# Patient Record
Sex: Male | Born: 1955 | Race: White | Hispanic: No | Marital: Married | Smoking: Never smoker
Health system: Southern US, Community
[De-identification: ages and names within clinical notes are randomized; demographics above are authoritative.]

## PROBLEM LIST (undated history)

## (undated) DIAGNOSIS — I4891 Unspecified atrial fibrillation: Secondary | ICD-10-CM

## (undated) DIAGNOSIS — M25561 Pain in right knee: Secondary | ICD-10-CM

## (undated) DIAGNOSIS — M25569 Pain in unspecified knee: Secondary | ICD-10-CM

## (undated) DIAGNOSIS — G8929 Other chronic pain: Secondary | ICD-10-CM

## (undated) DIAGNOSIS — M25562 Pain in left knee: Secondary | ICD-10-CM

## (undated) DIAGNOSIS — I10 Essential (primary) hypertension: Secondary | ICD-10-CM

## (undated) DIAGNOSIS — K635 Polyp of colon: Secondary | ICD-10-CM

## (undated) DIAGNOSIS — C439 Malignant melanoma of skin, unspecified: Secondary | ICD-10-CM

## (undated) DIAGNOSIS — K8050 Calculus of bile duct without cholangitis or cholecystitis without obstruction: Secondary | ICD-10-CM

## (undated) HISTORY — DX: Unspecified atrial fibrillation: I48.91

## (undated) HISTORY — DX: Pain in left knee: M25.562

## (undated) HISTORY — DX: Other chronic pain: G89.29

## (undated) HISTORY — DX: Calculus of bile duct without cholangitis or cholecystitis without obstruction: K80.50

## (undated) HISTORY — DX: Pain in unspecified knee: M25.569

## (undated) HISTORY — DX: Pain in left knee: M25.561

## (undated) HISTORY — DX: Essential (primary) hypertension: I10

## (undated) HISTORY — DX: Polyp of colon: K63.5

## (undated) HISTORY — DX: Malignant melanoma of skin, unspecified: C43.9

---

## 1987-07-25 DIAGNOSIS — C439 Malignant melanoma of skin, unspecified: Secondary | ICD-10-CM

## 1987-07-25 HISTORY — DX: Malignant melanoma of skin, unspecified: C43.9

## 1987-07-25 HISTORY — PX: MM US AXILLA RIGHT (ARMC HX): HXRAD1092

## 1990-01-21 HISTORY — PX: MM US AXILLA LEFT (ARMC HX): HXRAD1093

## 2010-07-24 HISTORY — PX: GALLBLADDER SURGERY: SHX652

## 2017-09-18 ENCOUNTER — Other Ambulatory Visit
Admission: RE | Admit: 2017-09-18 | Discharge: 2017-09-18 | Disposition: A | Discharge status: Home / Self Care | Payer: BLUE CROSS/BLUE SHIELD | Source: Ambulatory Visit | Attending: Medical | Admitting: Medical

## 2017-09-18 DIAGNOSIS — Z79899 Other long term (current) drug therapy: Secondary | ICD-10-CM | POA: Diagnosis not present

## 2017-09-18 DIAGNOSIS — L603 Nail dystrophy: Secondary | ICD-10-CM | POA: Diagnosis present

## 2017-09-18 LAB — HEPATIC FUNCTION PANEL
ALBUMIN: 4.4 g/dL (ref 3.5–5.0)
ALK PHOS: 53 U/L (ref 38–126)
ALT: 31 U/L (ref 17–63)
AST: 23 U/L (ref 15–41)
BILIRUBIN TOTAL: 0.8 mg/dL (ref 0.3–1.2)
Bilirubin, Direct: 0.1 mg/dL (ref 0.1–0.5)
Indirect Bilirubin: 0.7 mg/dL (ref 0.3–0.9)
TOTAL PROTEIN: 7.2 g/dL (ref 6.5–8.1)

## 2017-10-11 ENCOUNTER — Encounter: Payer: Self-pay | Admitting: Family Medicine

## 2017-10-11 ENCOUNTER — Ambulatory Visit (INDEPENDENT_AMBULATORY_CARE_PROVIDER_SITE_OTHER): Payer: BLUE CROSS/BLUE SHIELD | Admitting: Family Medicine

## 2017-10-11 VITALS — BP 122/84 | HR 84 | Resp 16 | Ht 68.0 in | Wt 252.0 lb

## 2017-10-11 DIAGNOSIS — K635 Polyp of colon: Secondary | ICD-10-CM

## 2017-10-11 DIAGNOSIS — M25562 Pain in left knee: Secondary | ICD-10-CM

## 2017-10-11 DIAGNOSIS — L309 Dermatitis, unspecified: Secondary | ICD-10-CM | POA: Diagnosis not present

## 2017-10-11 DIAGNOSIS — I10 Essential (primary) hypertension: Secondary | ICD-10-CM | POA: Diagnosis not present

## 2017-10-11 DIAGNOSIS — Z85820 Personal history of malignant melanoma of skin: Secondary | ICD-10-CM

## 2017-10-11 DIAGNOSIS — Z0000 Encounter for general adult medical examination without abnormal findings: Secondary | ICD-10-CM | POA: Diagnosis not present

## 2017-10-11 DIAGNOSIS — M25569 Pain in unspecified knee: Secondary | ICD-10-CM

## 2017-10-11 DIAGNOSIS — Z23 Encounter for immunization: Secondary | ICD-10-CM

## 2017-10-11 DIAGNOSIS — M25561 Pain in right knee: Secondary | ICD-10-CM | POA: Diagnosis not present

## 2017-10-11 DIAGNOSIS — E669 Obesity, unspecified: Secondary | ICD-10-CM | POA: Diagnosis not present

## 2017-10-11 DIAGNOSIS — H6121 Impacted cerumen, right ear: Secondary | ICD-10-CM | POA: Diagnosis not present

## 2017-10-11 DIAGNOSIS — B351 Tinea unguium: Secondary | ICD-10-CM | POA: Diagnosis not present

## 2017-10-11 DIAGNOSIS — I4891 Unspecified atrial fibrillation: Secondary | ICD-10-CM | POA: Diagnosis not present

## 2017-10-11 DIAGNOSIS — G8929 Other chronic pain: Secondary | ICD-10-CM | POA: Insufficient documentation

## 2017-10-11 NOTE — Progress Notes (Addendum)
Date:  10/11/2017   Name:  David Evans   DOB:  1956/02/01   MRN:  [id number]  PCP:  Adline Potter, MD    Chief Complaint: Establish Care ([place] moved here 7 weeks ago. ) and Tinnitus (Has wax build up and tried drops and several other things and can not get this better. ENT? )   History of Present Illness:  This is a 62 y.o. male seen for initial visit. C/o wax buildup/ringing R ear, OTC flush not effective. HTN on metoprolol/amlodipine, hx Pafib on asa only per cards, no episodes past month. Uses Lidex prn for leg rash, on Lamisil past two months for onychomycosis multiple toenails. Hx malignant melanoma 1989 no sequalae. Colonic polyps, gets colonoscopy q35yr, last 2 yrs ago. Seeing ortho in [place] for B knee pain. Father died 62 dementia/CVA/DM, mother alive 11 breast ca/afib/blood clots, sibs ok. Tet status unknown, no pneumo or zoster imms.  Review of Systems:  Review of Systems  Constitutional: Negative for chills and fever.  HENT: Negative for ear pain, sinus pain and sore throat.   Respiratory: Negative for cough and shortness of breath.   Cardiovascular: Negative for chest pain and leg swelling.  Gastrointestinal: Negative for abdominal pain, constipation and diarrhea.  Endocrine: Negative for polydipsia and polyuria.  Genitourinary: Negative for difficulty urinating.  Neurological: Negative for syncope and light-headedness.    Patient Active Problem List   Diagnosis Date Noted  . 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 Hypertension 10/11/2017  . Onychomycosis 10/11/2017  . Obesity (BMI 30-39.9) 10/11/2017  . Colon polyps 10/11/2017  . Eczema 10/11/2017  . History of malignant melanoma 10/11/2017  . Chronic knee pain 10/11/2017    Prior to Admission medications   Medication Sig Start Date End Date Taking? Authorizing Provider  amLODipine (NORVASC) 5 MG tablet Take 5 mg by mouth daily. 09/11/17  Yes [provider]  fluocinonide cream (LIDEX) 0.05  % APPLY TWICEA DAY TO RASH ON LEGS FOR SEVERE FLARE (NOT ON FACE) 07/19/17  Yes [provider]  metoprolol succinate (TOPROL-XL) 25 MG 24 hr tablet Take 1 tablet by mouth daily. 08/06/17  Yes [provider]  terbinafine (LAMISIL) 250 MG tablet Take 1 tablet by mouth daily. 09/02/17  Yes [provider]    No Known Allergies  Past Surgical History:  Procedure Laterality Date  . GALLBLADDER SURGERY  07/24/2010  . MM [place] AXILLA LEFT ([place] HX)  01/21/1990  . MM [place] AXILLA RIGHT ([place] HX)  1989    Social History   Tobacco Use  . Smoking status: Never Smoker  . Smokeless tobacco: Never Used  Substance Use Topics  . Alcohol use: Never    Frequency: Never  . Drug use: Never    Family History  Problem Relation Age of Onset  . Cancer Mother        breast  . Atrial fibrillation Mother   . Diabetes Father   . Stroke Father   . Cancer Sister   . Cancer Brother        appenix    Medication list has been reviewed and updated.  Physical Examination: BP 122/84   Pulse 84   Resp 16   Ht 5\' 8"  (1.727 m)   Wt 252 lb (114.3 kg)   SpO2 98%   BMI 38.32 kg/m   Physical Exam  Constitutional: He is oriented to person, place, and time. He appears well-developed and well-nourished.  HENT:  Head: Normocephalic and atraumatic.  Right Ear: External ear normal.  Left Ear: External ear normal.  Nose: Nose normal.  Mouth/Throat: Oropharynx is clear and moist.  R TM obscured by cerumen, clear after flush  Eyes: Pupils are equal, round, and reactive to light. Conjunctivae and EOM are normal.  Neck: Neck supple. No thyromegaly present.  Cardiovascular: Normal rate, regular rhythm, normal heart sounds and intact distal pulses.  Pulmonary/Chest: Effort normal and breath sounds normal.  Abdominal: Soft. He exhibits no distension and no mass.  Musculoskeletal: He exhibits no edema.  Lymphadenopathy:    He has no cervical adenopathy.  Neurological: He is alert and  oriented to person, place, and time. Coordination normal.  Romberg neg, gait normal  Skin: Skin is warm and dry.  Onychomycosis multiple toenails  Psychiatric: He has a normal mood and affect. His behavior is normal.  Nursing note and vitals reviewed.   Assessment and Plan:  1. Lone atrial fibrillation [facility]) On metoprolol/asa only per cards  2. Essential hypertension Well controlled on metoprolol XL, amlodipine - Comprehensive Metabolic Panel (CMET) - CBC  3. Impacted cerumen of right ear Flushed to clear, recommended monthly OTC flushes  4. Onychomycosis On Lamisil x 6 months per derm in [place]  5. Chronic pain of both knees Seeing ortho in [place]  6. Eczema, unspecified type On Lidex prn  7. Obesity (BMI 30-39.9) Exercise/weight loss discussed - TSH - Lipid Profile  8. Polyp of colon, unspecified part of colon, unspecified type Colonoscopy q39yr, due 2020  9. History of malignant melanoma 1989 No sequelae  10. Need for diphtheria-tetanus-pertussis (Tdap) vaccine - Tdap vaccine greater than or equal to 7yo IM  11. Healthcare maintenance Consider Shingrix next visit - Hepatitis C Antibody - HIV antibody (with reflex)  Return in about 1 month (around 11/08/2017).   One hour spent with pt/family over half in counseling  Teia Freitas M. [facility]  10/11/2017

## 2017-10-12 LAB — COMPREHENSIVE METABOLIC PANEL
A/G RATIO: 1.8 (ref 1.2–2.2)
ALK PHOS: 55 IU/L (ref 39–117)
ALT: 32 IU/L (ref 0–44)
AST: 23 IU/L (ref 0–40)
Albumin: 4.5 g/dL (ref 3.6–4.8)
BILIRUBIN TOTAL: 0.4 mg/dL (ref 0.0–1.2)
BUN / CREAT RATIO: 12 (ref 10–24)
BUN: 12 mg/dL (ref 8–27)
CHLORIDE: 103 mmol/L (ref 96–106)
CO2: 23 mmol/L (ref 20–29)
Calcium: 9.3 mg/dL (ref 8.6–10.2)
Creatinine, Ser: 1.02 mg/dL (ref 0.76–1.27)
GFR calc Af Amer: 92 mL/min/{1.73_m2} (ref >59)
GFR calc non Af Amer: 80 mL/min/{1.73_m2} (ref >59)
GLOBULIN, TOTAL: 2.5 g/dL (ref 1.5–4.5)
GLUCOSE: 96 mg/dL (ref 65–99)
POTASSIUM: 4 mmol/L (ref 3.5–5.2)
SODIUM: 141 mmol/L (ref 134–144)
Total Protein: 7 g/dL (ref 6.0–8.5)

## 2017-10-12 LAB — CBC
Hematocrit: 42.4 % (ref 37.5–51.0)
Hemoglobin: 14.3 g/dL (ref 13.0–17.7)
MCH: 30 pg (ref 26.6–33.0)
MCHC: 33.7 g/dL (ref 31.5–35.7)
MCV: 89 fL (ref 79–97)
Platelets: 169 10*3/uL (ref 150–379)
RBC: 4.77 x10E6/uL (ref 4.14–5.80)
RDW: 16.5 % — AB (ref 12.3–15.4)
WBC: 6.5 10*3/uL (ref 3.4–10.8)

## 2017-10-12 LAB — LIPID PANEL
CHOLESTEROL TOTAL: 160 mg/dL (ref 100–199)
Chol/HDL Ratio: 4 ratio (ref 0.0–5.0)
HDL: 40 mg/dL (ref >39)
LDL Calculated: 95 mg/dL (ref 0–99)
TRIGLYCERIDES: 125 mg/dL (ref 0–149)
VLDL Cholesterol Cal: 25 mg/dL (ref 5–40)

## 2017-10-12 LAB — HEPATITIS C ANTIBODY: Hep C Virus Ab: <0.1 s/co ratio (ref 0.0–0.9)

## 2017-10-12 LAB — HIV ANTIBODY (ROUTINE TESTING W REFLEX): HIV Screen 4th Generation wRfx: NONREACTIVE

## 2017-10-12 LAB — TSH: TSH: 4.88 u[IU]/mL — AB (ref 0.450–4.500)

## 2018-06-30 ENCOUNTER — Emergency Department
Admission: EM | Admit: 2018-06-30 | Discharge: 2018-06-30 | Disposition: A | Discharge status: Home / Self Care | Payer: BLUE CROSS/BLUE SHIELD | Attending: Emergency Medicine | Admitting: Emergency Medicine

## 2018-06-30 ENCOUNTER — Other Ambulatory Visit: Payer: Self-pay

## 2018-06-30 ENCOUNTER — Encounter: Payer: Self-pay | Admitting: Emergency Medicine

## 2018-06-30 DIAGNOSIS — Z7982 Long term (current) use of aspirin: Secondary | ICD-10-CM | POA: Insufficient documentation

## 2018-06-30 DIAGNOSIS — Y998 Other external cause status: Secondary | ICD-10-CM | POA: Insufficient documentation

## 2018-06-30 DIAGNOSIS — Z85820 Personal history of malignant melanoma of skin: Secondary | ICD-10-CM | POA: Insufficient documentation

## 2018-06-30 DIAGNOSIS — I10 Essential (primary) hypertension: Secondary | ICD-10-CM | POA: Insufficient documentation

## 2018-06-30 DIAGNOSIS — Y929 Unspecified place or not applicable: Secondary | ICD-10-CM | POA: Diagnosis not present

## 2018-06-30 DIAGNOSIS — T148XXA Other injury of unspecified body region, initial encounter: Secondary | ICD-10-CM | POA: Diagnosis not present

## 2018-06-30 DIAGNOSIS — R109 Unspecified abdominal pain: Secondary | ICD-10-CM | POA: Insufficient documentation

## 2018-06-30 DIAGNOSIS — Y9389 Activity, other specified: Secondary | ICD-10-CM | POA: Diagnosis not present

## 2018-06-30 DIAGNOSIS — X58XXXA Exposure to other specified factors, initial encounter: Secondary | ICD-10-CM | POA: Diagnosis not present

## 2018-06-30 DIAGNOSIS — Z79899 Other long term (current) drug therapy: Secondary | ICD-10-CM | POA: Insufficient documentation

## 2018-06-30 LAB — URINALYSIS, COMPLETE (UACMP) WITH MICROSCOPIC
Bacteria, UA: NONE SEEN
Bilirubin Urine: NEGATIVE
Glucose, UA: NEGATIVE mg/dL
Hgb urine dipstick: NEGATIVE
KETONES UR: NEGATIVE mg/dL
Leukocytes, UA: NEGATIVE
Nitrite: NEGATIVE
PH: 5 (ref 5.0–8.0)
Protein, ur: NEGATIVE mg/dL
SPECIFIC GRAVITY, URINE: 1.021 (ref 1.005–1.030)
Squamous Epithelial / LPF: NONE SEEN (ref 0–5)

## 2018-06-30 LAB — CBC
HCT: 42.8 % (ref 39.0–52.0)
HEMOGLOBIN: 13.9 g/dL (ref 13.0–17.0)
MCH: 27.7 pg (ref 26.0–34.0)
MCHC: 32.5 g/dL (ref 30.0–36.0)
MCV: 85.4 fL (ref 80.0–100.0)
NRBC: 0 % (ref 0.0–0.2)
PLATELETS: 147 10*3/uL — AB (ref 150–400)
RBC: 5.01 MIL/uL (ref 4.22–5.81)
RDW: 12.4 % (ref 11.5–15.5)
WBC: 7 10*3/uL (ref 4.0–10.5)

## 2018-06-30 LAB — BASIC METABOLIC PANEL
ANION GAP: 6 (ref 5–15)
BUN: 20 mg/dL (ref 8–23)
CALCIUM: 9.2 mg/dL (ref 8.9–10.3)
CHLORIDE: 107 mmol/L (ref 98–111)
CO2: 29 mmol/L (ref 22–32)
Creatinine, Ser: 1.13 mg/dL (ref 0.61–1.24)
GFR calc non Af Amer: >60 mL/min (ref >60)
Glucose, Bld: 109 mg/dL — ABNORMAL HIGH (ref 70–99)
POTASSIUM: 4.4 mmol/L (ref 3.5–5.1)
Sodium: 142 mmol/L (ref 135–145)

## 2018-06-30 MED ORDER — NAPROXEN 500 MG PO TABS: 500.0000 mg | ORAL_TABLET | Freq: Two times a day (BID) | ORAL | 2 refills | Status: AC

## 2018-06-30 NOTE — ED Provider Notes (Signed)
[facility] Emergency Department Provider Note   ____________________________________________    I have reviewed the triage vital signs and the nursing notes.   HISTORY  Chief Complaint Flank Pain     HPI David Evans is a 62 y.o. male who presents with complaints of left and right flank discomfort which he noticed 2 days ago.  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  He denies any new activity recently.  No lower extremity pain or numbness or weakness.  No nausea or vomiting.  No hematuria.  No history of kidney stones.  Denies lower abdominal pain.  No radiation of pain.  No back pain.  Has not taken anything for this.   Past Medical History:  Diagnosis Date  . A-fib ([place])   . Colon polyps    every 3 year screening   . Gall bladder pain   . Hypertension   . Knee pain, bilateral   . Knee pain, chronic    Old Fracture   . Malignant melanoma ([place]) 1989   under right arm     Patient Active Problem List   Diagnosis Date Noted  . 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 Hypertension 10/11/2017  . Onychomycosis 10/11/2017  . Obesity (BMI 30-39.9) 10/11/2017  . Colon polyps 10/11/2017  . Eczema 10/11/2017  . History of malignant melanoma 10/11/2017  . Chronic knee pain 10/11/2017    Past Surgical History:  Procedure Laterality Date  . GALLBLADDER SURGERY  07/24/2010  . MM [place] AXILLA LEFT ([place] HX)  01/21/1990  . MM [place] AXILLA RIGHT ([place] HX)  1989    Prior to Admission medications   Medication Sig Start Date End Date Taking? Authorizing Provider  amLODipine (NORVASC) 5 MG tablet Take 5 mg by mouth daily. 09/11/17   [provider]  aspirin EC 81 MG tablet Take 81 mg by mouth daily.    [provider]  fluocinonide cream (LIDEX) 0.05 % APPLY TWICEA DAY TO RASH ON LEGS FOR SEVERE FLARE (NOT ON FACE) 07/19/17   [provider]  metoprolol succinate (TOPROL-XL) 25 MG 24 hr tablet Take 1 tablet by mouth  daily. 08/06/17   [provider]  Multiple Vitamin (MULTIVITAMIN) capsule Take 1 capsule by mouth daily.    [provider]  naproxen (NAPROSYN) 500 MG tablet Take 1 tablet (500 mg total) by mouth 2 (two) times daily with a meal. 06/30/18   Lavonia Drafts, MD  terbinafine (LAMISIL) 250 MG tablet Take 1 tablet by mouth daily. 09/02/17   [provider]     Allergies Patient has no known allergies.  Family History  Problem Relation Age of Onset  . Cancer Mother        breast  . Atrial fibrillation Mother   . Diabetes Father   . Stroke Father   . Cancer Sister   . Cancer Brother        appenix    Social History Social History   Tobacco Use  . Smoking status: Never Smoker  . Smokeless tobacco: Never Used  Substance Use Topics  . Alcohol use: Never    Frequency: Never  . Drug use: Never    Review of Systems  Constitutional: No fever/chills Eyes: No visual changes.  ENT: No sore throat. Cardiovascular: Denies chest pain. Respiratory: Denies shortness of breath. Gastrointestinal: As above Genitourinary: Negative for dysuria. Musculoskeletal: As above Skin: Negative for rash. Neurological: Negative for  weakness   ____________________________________________   PHYSICAL  EXAM:  VITAL SIGNS: ED Triage Vitals  Enc Vitals Group     BP 06/30/18 1854 (!) 143/74     Pulse Rate 06/30/18 1854 61     Resp 06/30/18 1854 16     Temp 06/30/18 1854 98.1 F (36.7 C)     Temp Source 06/30/18 1854 Oral     SpO2 06/30/18 1854 99 %     Weight 06/30/18 1855 98 kg (216 lb)     Height 06/30/18 1855 1.727 m (5\' 8" )     Head Circumference --      Peak Flow --      Pain Score 06/30/18 1854 7     Pain Loc --      Pain Edu? --      Excl. in [place]? --    Constitutional: Alert and oriented. No acute distress. Pleasant and interactive Eyes: Conjunctivae are normal.   Nose: No congestion/rhinnorhea. Mouth/Throat: Mucous membranes are moist.   Neck:  Painless  ROM Cardiovascular: Normal rate, regular rhythm. Grossly normal heart sounds.  Good peripheral circulation. Respiratory: Normal respiratory effort.  No retractions. Lungs CTAB. Gastrointestinal: Minimal tenderness to palpation along the posterior flanks bilaterally suspicious for oblique muscle strain/tenderness. No distention.  No CVA tenderness.  Musculoskeletal:   Warm and well perfused Neurologic:  Normal speech and language. No gross focal neurologic deficits are appreciated.  Skin:  Skin is warm, dry and intact. No rash noted. Psychiatric: Mood and affect are normal. Speech and behavior are normal.  ____________________________________________   LABS (all labs ordered are listed, but only abnormal results are displayed)  Labs Reviewed  URINALYSIS, COMPLETE (UACMP) WITH MICROSCOPIC - Abnormal; Notable for the following components:      Result Value   Color, Urine YELLOW (*)    APPearance CLEAR (*)    All other components within normal limits  BASIC METABOLIC PANEL - Abnormal; Notable for the following components:   Glucose, Bld 109 (*)    All other components within normal limits  CBC - Abnormal; Notable for the following components:   Platelets 147 (*)    All other components within normal limits   ____________________________________________  EKG  None ____________________________________________  RADIOLOGY  None ____________________________________________   PROCEDURES  Procedure(s) performed: No  Procedures   Critical Care performed: No ____________________________________________   INITIAL IMPRESSION / ASSESSMENT AND PLAN / ED COURSE  Pertinent labs & imaging results that were available during my care of the patient were reviewed by me and considered in my medical decision making (see chart for details).  Patient well-appearing in no acute distress.  No anterior abdominal discomfort or tenderness.  Exam is significant for mild tenderness overlying  the oblique muscles bilaterally, no vertebral tenderness of the spine.  Lab work is unremarkable.  Urinalysis negative for hematuria.  Recommend supportive care with anti-inflammatories, outpatient follow-up, return precautions discussed    ____________________________________________   FINAL CLINICAL IMPRESSION(S) / ED DIAGNOSES  Final diagnoses:  Flank pain  Muscle strain        Note:  This document was prepared using Dragon voice recognition software and may include unintentional dictation errors.      Lavonia Drafts, MD 06/30/18 2244

## 2018-06-30 NOTE — ED Notes (Signed)
Patient c/o bilateral flank pain beginning Thursday. Patient reports pain is more severe on left side. Patient rates left flank pain at 7 of 10, and right flank pain at 3 of 10; both described as sharp.  Patient denies urinary and bowel changes.   Patient denies hx of renal or bowel/abdominal issues.   Patient reports hx of afib. Patient does not take a blood thinner for afib. Patient reports taking both amlodipine and metoprolol.

## 2018-06-30 NOTE — ED Triage Notes (Signed)
Patient reports intermittent sharp pain in his left side since Thursday. Reports history of hernia and is unsure if this is related. Patient states pain is worse with sitting. Patient denies CP or SOB.

## 2018-06-30 NOTE — ED Notes (Signed)
Pt signed hardcopy of E-signature due to signature pad not working

## 2018-07-24 ENCOUNTER — Other Ambulatory Visit: Payer: Self-pay

## 2018-07-24 ENCOUNTER — Emergency Department: Payer: BLUE CROSS/BLUE SHIELD

## 2018-07-24 ENCOUNTER — Encounter: Payer: Self-pay | Admitting: Emergency Medicine

## 2018-07-24 DIAGNOSIS — R079 Chest pain, unspecified: Secondary | ICD-10-CM | POA: Diagnosis not present

## 2018-07-24 DIAGNOSIS — I10 Essential (primary) hypertension: Secondary | ICD-10-CM | POA: Diagnosis not present

## 2018-07-24 DIAGNOSIS — Z7982 Long term (current) use of aspirin: Secondary | ICD-10-CM | POA: Insufficient documentation

## 2018-07-24 DIAGNOSIS — Z85828 Personal history of other malignant neoplasm of skin: Secondary | ICD-10-CM | POA: Diagnosis not present

## 2018-07-24 DIAGNOSIS — Z79899 Other long term (current) drug therapy: Secondary | ICD-10-CM | POA: Insufficient documentation

## 2018-07-24 LAB — COMPREHENSIVE METABOLIC PANEL
ALT: 20 U/L (ref 0–44)
AST: 19 U/L (ref 15–41)
Albumin: 4.2 g/dL (ref 3.5–5.0)
Alkaline Phosphatase: 66 U/L (ref 38–126)
Anion gap: 5 (ref 5–15)
BUN: 17 mg/dL (ref 8–23)
CHLORIDE: 108 mmol/L (ref 98–111)
CO2: 25 mmol/L (ref 22–32)
Calcium: 8.9 mg/dL (ref 8.9–10.3)
Creatinine, Ser: 1.08 mg/dL (ref 0.61–1.24)
Glucose, Bld: 88 mg/dL (ref 70–99)
POTASSIUM: 4 mmol/L (ref 3.5–5.1)
SODIUM: 138 mmol/L (ref 135–145)
Total Bilirubin: 0.6 mg/dL (ref 0.3–1.2)
Total Protein: 6.8 g/dL (ref 6.5–8.1)

## 2018-07-24 LAB — CBC WITH DIFFERENTIAL/PLATELET
Abs Immature Granulocytes: 0.01 10*3/uL (ref 0.00–0.07)
BASOS PCT: 0 %
Basophils Absolute: 0 10*3/uL (ref 0.0–0.1)
EOS ABS: 0.1 10*3/uL (ref 0.0–0.5)
Eosinophils Relative: 2 %
HCT: 41 % (ref 39.0–52.0)
Hemoglobin: 13.2 g/dL (ref 13.0–17.0)
Immature Granulocytes: 0 %
Lymphocytes Relative: 19 %
Lymphs Abs: 1.5 10*3/uL (ref 0.7–4.0)
MCH: 27.2 pg (ref 26.0–34.0)
MCHC: 32.2 g/dL (ref 30.0–36.0)
MCV: 84.4 fL (ref 80.0–100.0)
Monocytes Absolute: 0.9 10*3/uL (ref 0.1–1.0)
Monocytes Relative: 11 %
NEUTROS ABS: 5.5 10*3/uL (ref 1.7–7.7)
NRBC: 0 % (ref 0.0–0.2)
Neutrophils Relative %: 68 %
PLATELETS: 124 10*3/uL — AB (ref 150–400)
RBC: 4.86 MIL/uL (ref 4.22–5.81)
RDW: 13.5 % (ref 11.5–15.5)
WBC: 8.1 10*3/uL (ref 4.0–10.5)

## 2018-07-24 LAB — TROPONIN I: Troponin I: <0.03 ng/mL (ref <0.03)

## 2018-07-24 NOTE — ED Triage Notes (Signed)
Patient ambulatory to triage with steady gait, without difficulty or distress noted; pt reports mid sternal, right sided CP since last night; denies any accomp symptoms

## 2018-07-25 ENCOUNTER — Emergency Department
Admission: EM | Admit: 2018-07-25 | Discharge: 2018-07-25 | Disposition: A | Discharge status: Home / Self Care | Payer: BLUE CROSS/BLUE SHIELD | Attending: Emergency Medicine | Admitting: Emergency Medicine

## 2018-07-25 DIAGNOSIS — R079 Chest pain, unspecified: Secondary | ICD-10-CM

## 2018-07-25 LAB — TROPONIN I: Troponin I: <0.03 ng/mL (ref <0.03)

## 2018-07-25 LAB — FIBRIN DERIVATIVES D-DIMER (ARMC ONLY): Fibrin derivatives D-dimer (ARMC): 197.7 ng/mL (FEU) (ref 0.00–499.00)

## 2018-07-25 NOTE — ED Provider Notes (Signed)
[facility] Emergency Department Provider Note ____   First MD Initiated Contact with Patient 07/25/18 0300     (approximate)  I have reviewed the triage vital signs and the nursing notes.   HISTORY  Chief Complaint Chest Pain   HPI Daran Favaro is a 63 y.o. male with below list of chronic medical conditions presents to the emergency department with acute onset of sharp right lateral chest pain which occurred tonight while at work.  Patient denies any chest pain at present.  Patient denies any dyspnea.  Patient does admit however to a cough for the past week.  Patient denies any fever afebrile on presentation.  Patient denies any lower extremity pain or swelling.  Patient denies any history of DVT or PE.   Past Medical History:  Diagnosis Date  . A-fib ([place])   . Colon polyps    every 3 year screening   . Gall bladder pain   . Hypertension   . Knee pain, bilateral   . Knee pain, chronic    Old Fracture   . Malignant melanoma ([place]) 1989   under right arm     Patient Active Problem List   Diagnosis Date Noted  . 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 Hypertension 10/11/2017  . Onychomycosis 10/11/2017  . Obesity (BMI 30-39.9) 10/11/2017  . Colon polyps 10/11/2017  . Eczema 10/11/2017  . History of malignant melanoma 10/11/2017  . Chronic knee pain 10/11/2017    Past Surgical History:  Procedure Laterality Date  . GALLBLADDER SURGERY  07/24/2010  . MM [place] AXILLA LEFT ([place] HX)  01/21/1990  . MM [place] AXILLA RIGHT ([place] HX)  1989    Prior to Admission medications   Medication Sig Start Date End Date Taking? Authorizing Provider  amLODipine (NORVASC) 5 MG tablet Take 5 mg by mouth daily. 09/11/17   [provider]  aspirin EC 81 MG tablet Take 81 mg by mouth daily.    [provider]  fluocinonide cream (LIDEX) 0.05 % APPLY TWICEA DAY TO RASH ON LEGS FOR SEVERE FLARE (NOT ON FACE) 07/19/17   [provider]  metoprolol succinate (TOPROL-XL) 25 MG 24 hr tablet Take 1 tablet by mouth daily. 08/06/17   [provider]  Multiple Vitamin (MULTIVITAMIN) capsule Take 1 capsule by mouth daily.    [provider]  naproxen (NAPROSYN) 500 MG tablet Take 1 tablet (500 mg total) by mouth 2 (two) times daily with a meal. 06/30/18   Lavonia Drafts, MD  terbinafine (LAMISIL) 250 MG tablet Take 1 tablet by mouth daily. 09/02/17   [provider]    Allergies No known drug allergies  Family History  Problem Relation Age of Onset  . Cancer Mother        breast  . Atrial fibrillation Mother   . Diabetes Father   . Stroke Father   . Cancer Sister   . Cancer Brother        appenix    Social History Social History   Tobacco Use  . Smoking status: Never Smoker  . Smokeless tobacco: Never Used  Substance Use Topics  . Alcohol use: Never    Frequency: Never  . Drug use: Never    Review of Systems Constitutional: No fever/chills Eyes: No visual changes. ENT: No sore throat. Cardiovascular: Positive for right lateral chest pain chest pain. Respiratory: Denies shortness of breath.  Positive for cough Gastrointestinal: No abdominal pain.  No nausea, no vomiting.  No diarrhea.  No constipation. Genitourinary: Negative for dysuria. Musculoskeletal: Negative for neck pain.  Negative for back pain. Integumentary: Negative for rash. Neurological: Negative for headaches, focal weakness or numbness.   ____________________________________________   PHYSICAL EXAM:  VITAL SIGNS: ED Triage Vitals  Enc Vitals Group     BP 07/24/18 2152 (!) 152/77     Pulse Rate 07/24/18 2152 (!) 55     Resp 07/24/18 2152 18     Temp 07/24/18 2152 98 F (36.7 C)     Temp Source 07/24/18 2152 Oral     SpO2 07/24/18 2152 98 %     Weight 07/24/18 2143 95.7 kg (211 lb)     Height 07/24/18 2143 1.727 m (5\' 8" )     Head Circumference --      Peak Flow --      Pain Score 07/24/18 2143 8      Pain Loc --      Pain Edu? --      Excl. in [place]? --     Constitutional: Alert and oriented. Well appearing and in no acute distress. Eyes: Conjunctivae are normal.  Mouth/Throat: Mucous membranes are moist.  Oropharynx non-erythematous. Neck: No stridor.  Cardiovascular: Normal rate, regular rhythm. Good peripheral circulation. Grossly normal heart sounds. Respiratory: Normal respiratory effort.  No retractions. Lungs CTAB. Gastrointestinal: Soft and nontender. No distention.  Musculoskeletal: No lower extremity tenderness nor edema. No gross deformities of extremities. Neurologic:  Normal speech and language. No gross focal neurologic deficits are appreciated.  Skin:  Skin is warm, dry and intact. No rash noted. Psychiatric: Mood and affect are normal. Speech and behavior are normal.  ____________________________________________   LABS (all labs ordered are listed, but only abnormal results are displayed)  Labs Reviewed  CBC WITH DIFFERENTIAL/PLATELET - Abnormal; Notable for the following components:      Result Value   Platelets 124 (*)    All other components within normal limits  COMPREHENSIVE METABOLIC PANEL  TROPONIN I  FIBRIN DERIVATIVES D-DIMER ([facility] ONLY)  TROPONIN I   ____________________________________________  EKG  ED ECG REPORT I, Lopresti N Liticia Gasior, the attending physician, personally viewed and interpreted this ECG.   Date: 07/25/2018  EKG Time: 9:46 PM  Rate: 52  Rhythm: Sinus bradycardia  Axis: Normal  Intervals: Normal  ST&T Change: None  ____________________________________________  RADIOLOGY I, Foote N Ramiyah Mcclenahan, personally viewed and evaluated these images (plain radiographs) as part of my medical decision making, as well as reviewing the written report by the radiologist.  ED MD interpretation: No active cardiopulmonary disease per radiologist chest x-ray.  Official radiology report(s): Dg Chest 2 View  Result Date: 07/24/2018 CLINICAL DATA:   Chest pain EXAM: CHEST - 2 VIEW COMPARISON:  None. FINDINGS: No focal opacity or pleural effusion. Normal heart size. No pneumothorax. Postsurgical changes within both axilla. IMPRESSION: No active cardiopulmonary disease. Electronically Signed   By: Donavan Foil M.D.   On: 07/24/2018 22:09     Procedures   ____________________________________________   INITIAL IMPRESSION / ASSESSMENT AND PLAN / ED COURSE  As part of my medical decision making, I reviewed the following data within the electronic MEDICAL RECORD NUMBER  63 year old male presenting with above-stated history of sharp right lateral chest pain which is resolved.  Consider the possibility of CAD and a such EKG was performed which revealed no evidence of ischemia or infarction laboratory data including troponin x2-.  Also consider the possibility of PE given the patient's history of atrial fibrillation in  the past and currently not on anticoagulation.  Patient low risk and as such d-dimer was performed which was negative.  Patient referred to PMD for further outpatient evaluation as no clear etiology of the patient's chest pain identified ____________________________________________  FINAL CLINICAL IMPRESSION(S) / ED DIAGNOSES  Final diagnoses:  Chest pain, unspecified type     MEDICATIONS GIVEN DURING THIS VISIT:  Medications - No data to display   ED Discharge Orders    None       Note:  This document was prepared using Dragon voice recognition software and may include unintentional dictation errors.    Gregor Hams, MD 07/25/18 [phone]

## 2018-09-22 DEATH — deceased

## 2020-01-02 IMAGING — CR DG CHEST 2V
1 series · 2 of 2 positions shown · non-contrast
Comparison: None.

CLINICAL DATA: Chest pain

EXAM:
CHEST - 2 VIEW

[Series 1: dg chest 2 view · 0.14mm/px · 2 of 2 slices shown]
[im 1/2]
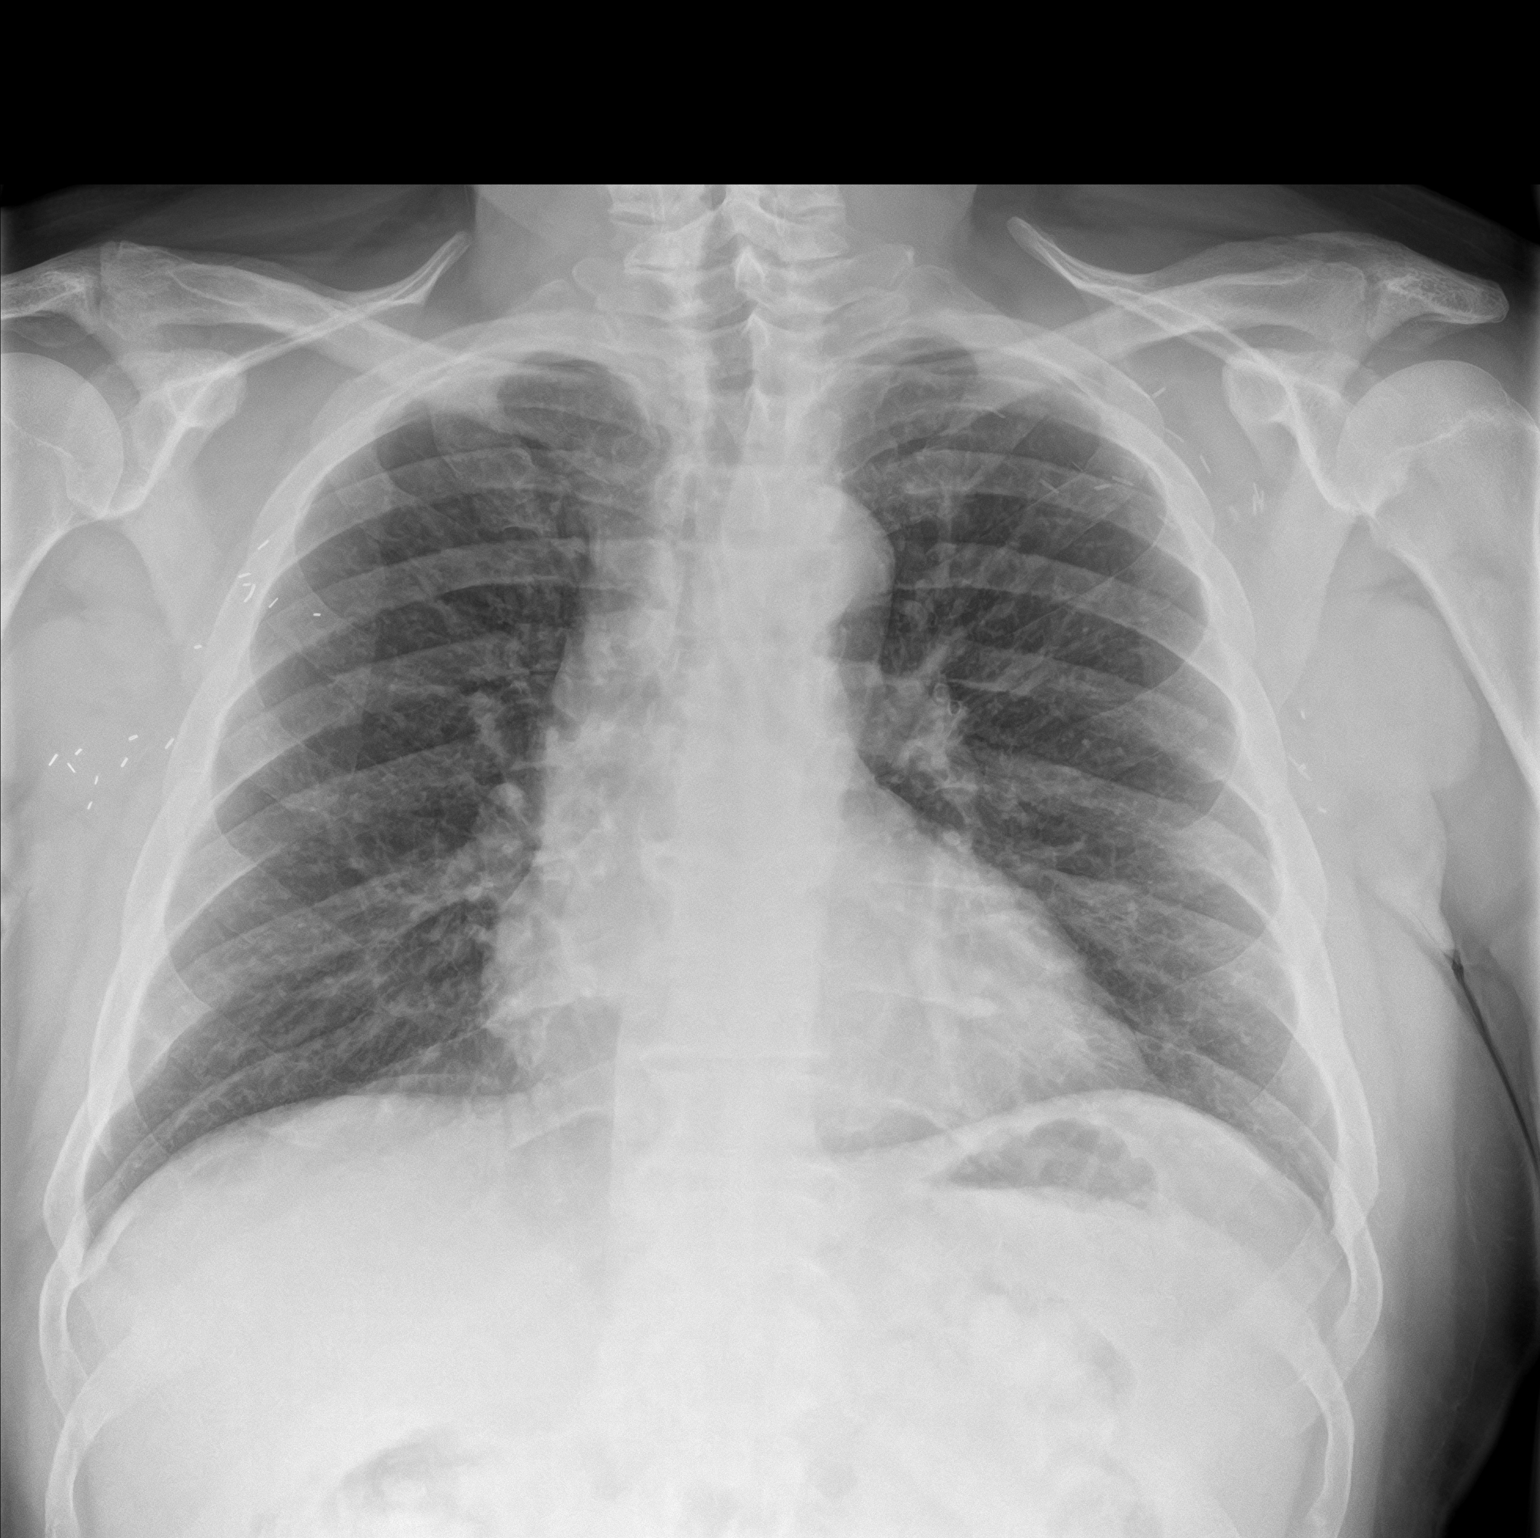
[im 2/2]
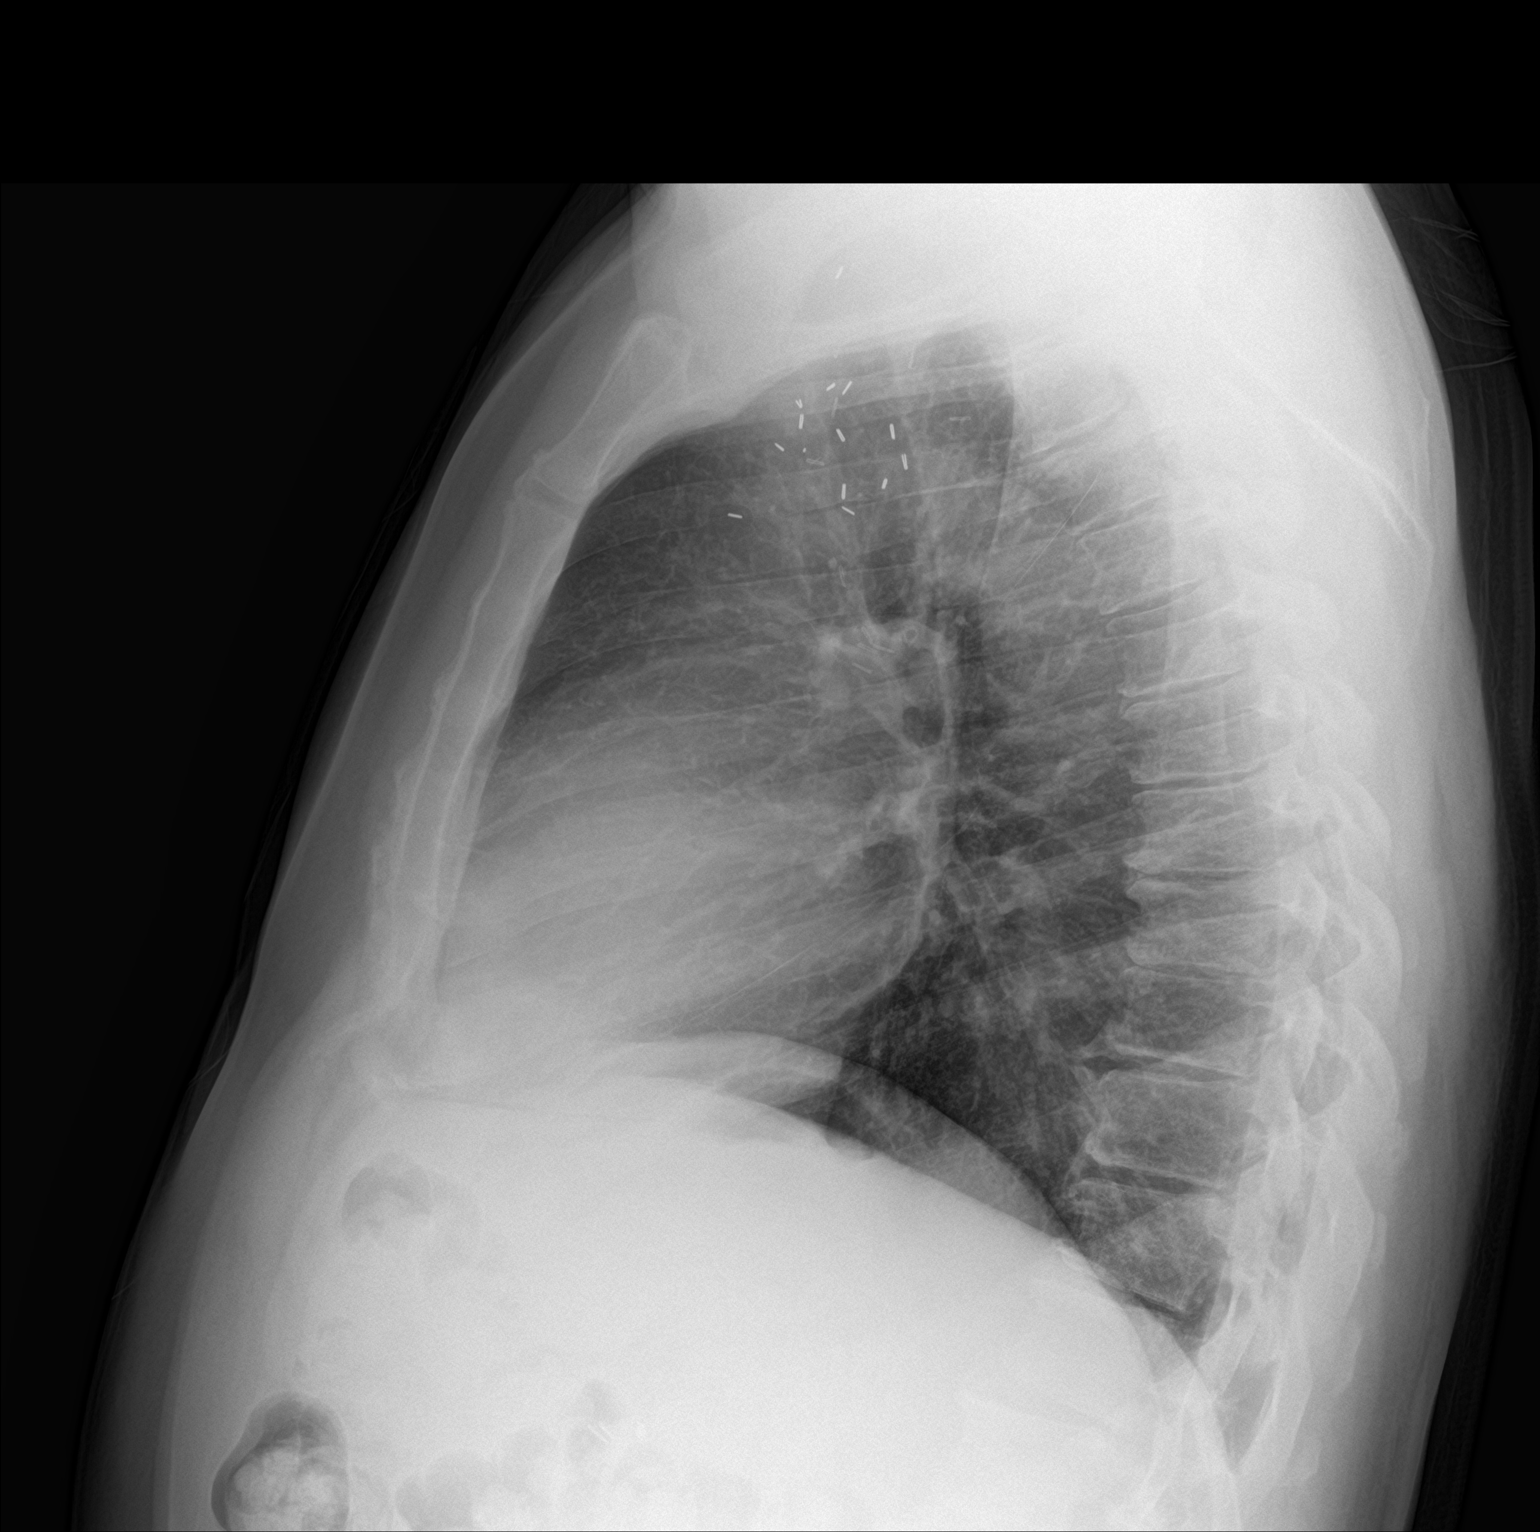

[2 of 2 positions shown; findings below may reference images not displayed]

FINDINGS: No focal opacity or pleural effusion. Normal heart size. No
pneumothorax. Postsurgical changes within both axilla.
IMPRESSION: No active cardiopulmonary disease.
